# Patient Record
Sex: Male | Born: 1950 | Race: White | Hispanic: No | Marital: Married | Smoking: Current every day smoker
Health system: Southern US, Community
[De-identification: ages and names within clinical notes are randomized; demographics above are authoritative.]

## PROBLEM LIST (undated history)

## (undated) DIAGNOSIS — I10 Essential (primary) hypertension: Secondary | ICD-10-CM

## (undated) DIAGNOSIS — C4490 Unspecified malignant neoplasm of skin, unspecified: Secondary | ICD-10-CM

## (undated) DIAGNOSIS — Z9849 Cataract extraction status, unspecified eye: Secondary | ICD-10-CM

## (undated) HISTORY — DX: Cataract extraction status, unspecified eye: Z98.49

## (undated) HISTORY — DX: Essential (primary) hypertension: I10

## (undated) HISTORY — DX: Unspecified malignant neoplasm of skin, unspecified: C44.90

## (undated) HISTORY — PX: SKIN CANCER EXCISION: SHX779

---

## 2013-10-27 DIAGNOSIS — K635 Polyp of colon: Secondary | ICD-10-CM | POA: Insufficient documentation

## 2014-08-09 LAB — TROPONIN T: Troponin T: <0.01

## 2014-08-09 LAB — BASIC METABOLIC PANEL
CREATININE: 0.9 mg/dL (ref 0.6–1.3)
POTASSIUM: 4.2 mmol/L (ref 3.4–5.3)
SODIUM: 139 mmol/L (ref 137–147)

## 2014-08-09 LAB — DRUGS OF ABUSE SCREEN W/O ALC, ROUTINE URINE
AMPHETAMINE SCREEN URINE: NEGATIVE
BENZODIAZEPINE SCREEN, URINE: NEGATIVE
Barbiturate screen, urine: NEGATIVE
CANNABINOID SCRN UR: NEGATIVE
Cocaine (Metab) Scrn, Ur: NEGATIVE
METHADONE SCREEN, URINE: NEGATIVE
OPIATE SCREEN URINE: NEGATIVE
OXYCODONE SCRN UR: NEGATIVE
URINE, PH: 6

## 2014-08-09 LAB — COMPREHENSIVE METABOLIC PANEL: CHLORIDE: 100 mmol/L

## 2014-08-09 LAB — MAGNESIUM: Magnesium: 2.1

## 2014-08-09 LAB — CBC WITH DIFFERENTIAL: MCV: 85

## 2014-08-09 LAB — CBC AND DIFFERENTIAL
HCT: 52 % (ref 41–53)
Hemoglobin: 17.6 g/dL — AB (ref 13.5–17.5)
Platelets: 225 10*3/uL (ref 150–399)
WBC: 12.7 10*3/mL

## 2014-08-09 LAB — HEPATIC FUNCTION PANEL
ALT: 30 U/L (ref 10–40)
AST: 23 U/L (ref 14–40)
Bilirubin, Total: 0.4 mg/dL

## 2015-01-26 ENCOUNTER — Ambulatory Visit (INDEPENDENT_AMBULATORY_CARE_PROVIDER_SITE_OTHER): Payer: Self-pay | Admitting: Family Medicine

## 2015-01-26 ENCOUNTER — Encounter: Payer: Self-pay | Admitting: Family Medicine

## 2015-01-26 VITALS — BP 184/73 | HR 64 | Ht 71.0 in | Wt 220.0 lb

## 2015-01-26 DIAGNOSIS — Z23 Encounter for immunization: Secondary | ICD-10-CM

## 2015-01-26 DIAGNOSIS — M549 Dorsalgia, unspecified: Secondary | ICD-10-CM

## 2015-01-26 DIAGNOSIS — E669 Obesity, unspecified: Secondary | ICD-10-CM | POA: Insufficient documentation

## 2015-01-26 DIAGNOSIS — Z1211 Encounter for screening for malignant neoplasm of colon: Secondary | ICD-10-CM

## 2015-01-26 DIAGNOSIS — F1010 Alcohol abuse, uncomplicated: Secondary | ICD-10-CM | POA: Insufficient documentation

## 2015-01-26 DIAGNOSIS — I10 Essential (primary) hypertension: Secondary | ICD-10-CM

## 2015-01-26 DIAGNOSIS — M542 Cervicalgia: Secondary | ICD-10-CM | POA: Insufficient documentation

## 2015-01-26 DIAGNOSIS — C4490 Unspecified malignant neoplasm of skin, unspecified: Secondary | ICD-10-CM

## 2015-01-26 DIAGNOSIS — G8929 Other chronic pain: Secondary | ICD-10-CM | POA: Insufficient documentation

## 2015-01-26 DIAGNOSIS — R609 Edema, unspecified: Secondary | ICD-10-CM

## 2015-01-26 DIAGNOSIS — G4733 Obstructive sleep apnea (adult) (pediatric): Secondary | ICD-10-CM

## 2015-01-26 MED ORDER — HYDROCHLOROTHIAZIDE 25 MG PO TABS: 25.0000 mg | ORAL_TABLET | Freq: Every day | ORAL | Status: DC

## 2015-01-26 NOTE — Assessment & Plan Note (Signed)
Likely given history and physical findings. Will refer for polysomnography in future.

## 2015-01-26 NOTE — Assessment & Plan Note (Signed)
Deferred skin exam and referral to dermatology today. Counseled patients on concerning skin lesion characteristics.

## 2015-01-26 NOTE — Progress Notes (Signed)
Manuel Dawson is a 65 y.o. male who presents to [facility] today for establish care.  Patient recently seen in ER for bilateral LE. He was given HCTZ for concurrent high blood pressure, which he has also received in the past for blood pressure. He ran out of the prescription and did not have PCP, which brought him in today.  Bilateral lower edema: Worsening over last 3 months. Has taken lasix in the past which he couldn't tolerate due to urinary frequency.   HTN: Diagnosed in the past. Always has been borderline according to patient. Has smoked cigarettes and then cigars daily for over 45 years. He has cut back cigar intake to 3/day but would not like to cut back any further at this point. Patient drinks 6-8 light beers daily, had been told by his ex-wife to cut back over 10 years ago. He nor his current wife have had thoughts of need to cut back, been annoyed by his ex wife asking him to cut back, has never felt guilty about his drinking, has never had an eye opener.   Back pain: Notes central axial back pain that he would like to defer discussing until a future visit. Notes having to sleep sitting up in a chair to sleep comfortably.   Skin cancer: works outside, does not use sunscreen, prior mole removed and unknown skin cancer on face removed. Has not seen dermatologist since removal of skin CA on face. Defers complete skin exam today.   OSA: Snores loudly, daytime somnolence. No night time apneic events. No morning headaches. Has not had polysomnography in the past.   Health maintenance: Patient has never had colonoscopy, willing to undergo referral process. Patient over 10 years since last Tdap. Willing to have today. Has not been tested for Hep C and denies having HIV, though says he is willing to get tested for both. Patient defers shingles and flu vaccines.     Past Medical  History  Diagnosis Date  . Hypertension   . Skin cancer    Past Surgical History  Procedure Laterality Date  . Skin cancer excision     Social History  Substance Use Topics  . Smoking status: Current Every Day Smoker -- 3.00 packs/day for 45 years    Types: Cigars  . Smokeless tobacco: Not on file  . Alcohol Use: 30.0 oz/week    0 Standard drinks or equivalent, 50 Cans of beer per week   family history includes Heart disease in his father; Stroke in his mother.  ROS as above: No fevers chills nausea vomiting diarrhea chest pain or palpitations. Patient does not feel too hot or too cold. No significant hair loss. Medications: Current Outpatient Prescriptions  Medication Sig Dispense Refill  . hydrochlorothiazide (HYDRODIURIL) 25 MG tablet Take 1 tablet (25 mg total) by mouth daily. 30 tablet 0   No current facility-administered medications for this visit.   No Known Allergies   Exam:  BP 184/73 mmHg  Pulse 64  Ht 5\' 11"  (1.803 m)  Wt 220 lb (99.791 kg)  BMI 30.70 kg/m2 Gen: Chronically ill appearing man in NAD HEENT: EOMI,  MMM Lungs: Normal work of breathing. CTABL, no wheezes or crackles.  Heart: RRR no MRG. No JVD appreciated.  Abd: NABS, Soft. Nondistended, Nontender. Protuberant abdomen. No HSM noted or fluid waves appreciated.  Exts: Brisk capillary refill, warm and well perfused. Bilateral 1+ edema to the knee bilaterally. Blue hue discoloration to bilateral  LE. Pulses 2+ DP but 1+ in PT. Toenails yellow and opacified, poorly maintained.  Psych: Alert and oriented normal affect speech and thought process.  No results found for this or any previous visit (from the past 24 hour(s)). No results found.  EKG: 60 BPM PR 148 QRS 80 QTc 426 Nl axis Impression: Normal sinus rhythm, no signs of LVH  Please see individual assessment and plan sections.

## 2015-01-26 NOTE — Assessment & Plan Note (Signed)
Refer to gastroenterology next visit.

## 2015-01-26 NOTE — Assessment & Plan Note (Addendum)
Likely chronic, uncontrolled. EKG negative for LVH. Will consider echo in the future. Will start back HCTZ 25mg . RTC in 2 weeks to review labs and consider adding ACE-I.   Will get screening labs for general health maintenance, administered TDap vaccine.

## 2015-01-26 NOTE — Patient Instructions (Signed)
Thank you for coming in today. You were seen today to establish care. You have high blood pressure, we will restart one medication today, but you will likely need more. We would like to see you 2-4 weeks to check your kidney function to make sure the medication is working properly.  We will get a number of screening labs. Please return for those when ou have had nothing to eat or drink (but water) after midnight.   Terbinafine tablets What is this medicine? TERBINAFINE (TER bin a feen) is an antifungal medicine. It is used to treat certain kinds of fungal or yeast infections. This medicine may be used for other purposes; ask your health care provider or pharmacist if you have questions. What should I tell my health care provider before I take this medicine? They need to know if you have any of these conditions: -drink alcoholic beverages -kidney disease -liver disease -an unusual or allergic reaction to terbinafine, other medicines, foods, dyes, or preservatives -pregnant or trying to get pregnant -breast-feeding How should I use this medicine? Take this medicine by mouth with a full glass of water. Follow the directions on the prescription label. You can take this medicine with food or on an empty stomach. Take your medicine at regular intervals. Do not take your medicine more often than directed. Do not skip doses or stop your medicine early even if you feel better. Do not stop taking except on your doctor's advice. Talk to your pediatrician regarding the use of this medicine in children. Special care may be needed. Overdosage: If you think you have taken too much of this medicine contact a poison control center or emergency room at once. NOTE: This medicine is only for you. Do not share this medicine with others. What if I miss a dose? If you miss a dose, take it as soon as you can. If it is almost time for your next dose, take only that dose. Do not take double or extra doses. What may  interact with this medicine? Do not take this medicine with any of the following medications: -thioridazine This medicine may also interact with the following medications: -beta-blockers -caffeine -cimetidine -cyclosporine -medicines for depression, anxiety, or psychotic disturbances -medicines for fungal infections like fluconazole and ketoconazole -medicines for irregular heartbeat like amiodarone, flecainide and propafenone -rifampin -warfarin This list may not describe all possible interactions. Give your health care provider a list of all the medicines, herbs, non-prescription drugs, or dietary supplements you use. Also tell them if you smoke, drink alcohol, or use illegal drugs. Some items may interact with your medicine. What should I watch for while using this medicine? Visit your doctor or health care provider regularly. Tell your doctor right away if you have nausea or vomiting, loss of appetite, stomach pain on your right upper side, yellow skin, dark urine, light stools, or are over tired. Some fungal infections need many weeks or months of treatment to cure. If you are taking this medicine for a long time, you will need to have important blood work done. What side effects may I notice from receiving this medicine? Side effects that you should report to your doctor or health care professional as soon as possible: -allergic reactions like skin rash or hives, swelling of the face, lips, or tongue -changes in vision -dark urine -fever or infection -general ill feeling or flu-like symptoms -light-colored stools -loss of appetite, nausea -redness, blistering, peeling or loosening of the skin, including inside the mouth -right upper belly  pain -unusually weak or tired -yellowing of the eyes or skin Side effects that usually do not require medical attention (report to your doctor or health care professional if they continue or are bothersome): -changes in taste -diarrhea -hair  loss -muscle or joint pain -stomach gas -stomach upset This list may not describe all possible side effects. Call your doctor for medical advice about side effects. You may report side effects to FDA at 1-800-FDA-1088. Where should I keep my medicine? Keep out of the reach of children. Store at room temperature below 25 degrees C (77 degrees F). Protect from light. Throw away any unused medicine after the expiration date. NOTE: This sheet is a summary. It may not cover all possible information. If you have questions about this medicine, talk to your doctor, pharmacist, or health care provider.    2016, Elsevier/Gold Standard. (2007-03-22 16:28:07)

## 2015-01-29 ENCOUNTER — Encounter: Payer: Self-pay | Admitting: Family Medicine

## 2015-01-29 LAB — CBC
HEMATOCRIT: 50.4 % (ref 39.0–52.0)
Hemoglobin: 17.4 g/dL — ABNORMAL HIGH (ref 13.0–17.0)
MCH: 29.2 pg (ref 26.0–34.0)
MCHC: 34.5 g/dL (ref 30.0–36.0)
MCV: 84.7 fL (ref 78.0–100.0)
MPV: 10.4 fL (ref 8.6–12.4)
Platelets: 241 10*3/uL (ref 150–400)
RBC: 5.95 MIL/uL — AB (ref 4.22–5.81)
RDW: 14.2 % (ref 11.5–15.5)
WBC: 7.5 10*3/uL (ref 4.0–10.5)

## 2015-01-29 LAB — HEMOGLOBIN A1C
HEMOGLOBIN A1C: 6.1 % — AB (ref <5.7)
MEAN PLASMA GLUCOSE: 128 mg/dL — AB (ref <117)

## 2015-01-30 LAB — HEPATITIS C ANTIBODY: HCV Ab: NEGATIVE

## 2015-01-30 LAB — LIPID PANEL
CHOL/HDL RATIO: 2.7 ratio (ref <=5.0)
CHOLESTEROL: 138 mg/dL (ref 125–200)
HDL: 52 mg/dL (ref >=40)
LDL Cholesterol: 79 mg/dL (ref <130)
TRIGLYCERIDES: 34 mg/dL (ref <150)
VLDL: 7 mg/dL (ref <30)

## 2015-01-30 LAB — COMPREHENSIVE METABOLIC PANEL
ALK PHOS: 78 U/L (ref 40–115)
ALT: 26 U/L (ref 9–46)
AST: 20 U/L (ref 10–35)
Albumin: 4.2 g/dL (ref 3.6–5.1)
BUN: 18 mg/dL (ref 7–25)
CALCIUM: 9.8 mg/dL (ref 8.6–10.3)
CO2: 23 mmol/L (ref 20–31)
Chloride: 101 mmol/L (ref 98–110)
Creat: 1.03 mg/dL (ref 0.70–1.25)
Glucose, Bld: 115 mg/dL — ABNORMAL HIGH (ref 65–99)
POTASSIUM: 4.6 mmol/L (ref 3.5–5.3)
Sodium: 138 mmol/L (ref 135–146)
TOTAL PROTEIN: 7.2 g/dL (ref 6.1–8.1)
Total Bilirubin: 0.5 mg/dL (ref 0.2–1.2)

## 2015-01-30 LAB — HIV ANTIBODY (ROUTINE TESTING W REFLEX): HIV 1&2 Ab, 4th Generation: NONREACTIVE

## 2015-01-30 LAB — TSH: TSH: 1.603 u[IU]/mL (ref 0.350–4.500)

## 2015-01-30 LAB — VITAMIN D 25 HYDROXY (VIT D DEFICIENCY, FRACTURES): Vit D, 25-Hydroxy: 28 ng/mL — ABNORMAL LOW (ref 30–100)

## 2015-02-01 ENCOUNTER — Encounter: Payer: Self-pay | Admitting: Family Medicine

## 2015-02-01 DIAGNOSIS — R7303 Prediabetes: Secondary | ICD-10-CM | POA: Insufficient documentation

## 2015-02-01 NOTE — Progress Notes (Signed)
Quick Note:  1) Vitamin D deficiency noted. Take 2000 units of vitamin D daily over-the-counter.  2) Pre-diabetes seen. Will discuss further at the next visit.   ______

## 2015-02-16 ENCOUNTER — Ambulatory Visit: Payer: BLUE CROSS/BLUE SHIELD | Admitting: Family Medicine

## 2015-02-16 ENCOUNTER — Telehealth: Payer: Self-pay | Admitting: Family Medicine

## 2015-02-16 DIAGNOSIS — I10 Essential (primary) hypertension: Secondary | ICD-10-CM

## 2015-02-16 NOTE — Telephone Encounter (Signed)
Pt was late to his appointment today. We will get labs and f/u at a later date/time.

## 2015-02-17 ENCOUNTER — Encounter: Payer: Self-pay | Admitting: Family Medicine

## 2015-02-17 ENCOUNTER — Ambulatory Visit: Payer: BLUE CROSS/BLUE SHIELD | Admitting: Family Medicine

## 2015-02-17 ENCOUNTER — Ambulatory Visit (INDEPENDENT_AMBULATORY_CARE_PROVIDER_SITE_OTHER): Payer: BLUE CROSS/BLUE SHIELD | Admitting: Family Medicine

## 2015-02-17 VITALS — BP 171/85 | HR 71 | Wt 217.0 lb

## 2015-02-17 DIAGNOSIS — I10 Essential (primary) hypertension: Secondary | ICD-10-CM | POA: Diagnosis not present

## 2015-02-17 DIAGNOSIS — R600 Localized edema: Secondary | ICD-10-CM | POA: Insufficient documentation

## 2015-02-17 DIAGNOSIS — Z1211 Encounter for screening for malignant neoplasm of colon: Secondary | ICD-10-CM

## 2015-02-17 DIAGNOSIS — G4733 Obstructive sleep apnea (adult) (pediatric): Secondary | ICD-10-CM | POA: Diagnosis not present

## 2015-02-17 LAB — BASIC METABOLIC PANEL
BUN: 12 mg/dL (ref 7–25)
CO2: 27 mmol/L (ref 20–31)
Calcium: 9.4 mg/dL (ref 8.6–10.3)
Chloride: 101 mmol/L (ref 98–110)
Creat: 0.91 mg/dL (ref 0.70–1.25)
GLUCOSE: 109 mg/dL — AB (ref 65–99)
POTASSIUM: 4.4 mmol/L (ref 3.5–5.3)
SODIUM: 138 mmol/L (ref 135–146)

## 2015-02-17 MED ORDER — LISINOPRIL-HYDROCHLOROTHIAZIDE 20-25 MG PO TABS: 1.0000 | ORAL_TABLET | Freq: Every day | ORAL | Status: DC

## 2015-02-17 NOTE — Assessment & Plan Note (Signed)
Add lisinopril to a combination pill to include lisinopril and hydrochlorothiazide 20/25. Recheck in 1 month.

## 2015-02-17 NOTE — Assessment & Plan Note (Signed)
Obtain home sleep study °

## 2015-02-17 NOTE — Telephone Encounter (Signed)
Quick Note:  Normal, no changes. ______ 

## 2015-02-17 NOTE — Assessment & Plan Note (Signed)
Kidney function normal. Obtain echocardiogram. Recheck in one month

## 2015-02-17 NOTE — Progress Notes (Signed)
       Manuel Dawson is a 65 y.o. male who presents to [facility] today for follow-up hypertension  1) hypertension: Patient was seen last month and found to be significantly hypertensive. He was started on hydrochlorothiazide. He tolerates the medicine well with no chest pains palpitations fevers or chills.  2) leg swelling: Patient notes lower extremity edema. This is been ongoing for some time and has not improved much with hydrochlorothiazide. Evaluation by other physicians have not shown significant pulmonary edema on chest x-rays. He has never had an echocardiogram.  3) possible sleep apnea: Patient has elevated blood pressure and snores. He feels tired frequently. He has never been tested for sleep apnea.  4) health maintenance: Due for colon cancer screening. Patient would like to have a colonoscopy instead of fecal DNA testing.   Past Medical History  Diagnosis Date  . Hypertension   . Skin cancer   . Cataract extraction status    Past Surgical History  Procedure Laterality Date  . Skin cancer excision     Social History  Substance Use Topics  . Smoking status: Current Every Day Smoker -- 3.00 packs/day for 45 years    Types: Cigars  . Smokeless tobacco: Not on file  . Alcohol Use: 30.0 oz/week    0 Standard drinks or equivalent, 50 Cans of beer per week   family history includes Heart disease in his father; Stroke in his mother.  ROS as above Medications: Current Outpatient Prescriptions  Medication Sig Dispense Refill  . aspirin 81 MG chewable tablet Chew by mouth.    Marland Kitchen lisinopril-hydrochlorothiazide (PRINZIDE,ZESTORETIC) 20-25 MG tablet Take 1 tablet by mouth daily. 30 tablet 0   No current facility-administered medications for this visit.   No Known Allergies   Exam:  BP 171/85 mmHg  Pulse 71  Wt 217 lb (98.431 kg)  Body mass index is 30.28  kg/(m^2).  Gen: Well NAD HEENT: EOMI,  MMM Lungs: Normal work of breathing. CTABL Heart: RRR no MRG Abd: NABS, Soft. Nondistended, Nontender Exts: Brisk capillary refill, warm and well perfused. 1+ bilateral lower extremity edema.  STOP BANG: Snore:    Yes Tired:     Yes Observed stop breathing:  No Hypertension:   Yes  BMI >35:   No Age >50:   Yes Neck > 16 inches:  Yes Male gender:   Yes ------------------------------------------ Total:     6/8   No results found for this or any previous visit (from the past 24 hour(s)). No results found.   Please see individual assessment and plan sections.

## 2015-02-17 NOTE — Assessment & Plan Note (Signed)
Colonoscopy ordered.

## 2015-02-17 NOTE — Patient Instructions (Signed)
Thank you for coming in today. 1) STOP HCTZ. Start lisinopril/hydrochlorothiazide blood pressure pill Return in one month 2) sleep: You should hear from [place] within a few days about a sleep study 3) colonoscopy: You should hear from digestive health specialist about setting up a colonoscopy in the near future 4) heart: We have arranged for a ultrasound of your heart. Please let me know if you do not hear from them soon.  Return in 1 month.  Call or go to the emergency room if you get worse, have trouble breathing, have chest pains, or palpitations.    Echocardiogram An echocardiogram, or echocardiography, uses sound waves (ultrasound) to produce an image of your heart. The echocardiogram is simple, painless, obtained within a short period of time, and offers valuable information to your health care provider. The images from an echocardiogram can provide information such as:  Evidence of coronary artery disease (CAD).  Heart size.  Heart muscle function.  Heart valve function.  Aneurysm detection.  Evidence of a past heart attack.  Fluid buildup around the heart.  Heart muscle thickening.  Assess heart valve function. LET [facility] CARE PROVIDER KNOW ABOUT:  Any allergies you have.  All medicines you are taking, including vitamins, herbs, eye drops, creams, and over-the-counter medicines.  Previous problems you or members of your family have had with the use of anesthetics.  Any blood disorders you have.  Previous surgeries you have had.  Medical conditions you have.  Possibility of pregnancy, if this applies. BEFORE THE PROCEDURE  No special preparation is needed. Eat and drink normally.  PROCEDURE   In order to produce an image of your heart, gel will be applied to your chest and a wand-like tool (transducer) will be moved over your chest. The gel will help transmit the sound waves from the transducer. The sound waves will harmlessly bounce off  your heart to allow the heart images to be captured in real-time motion. These images will then be recorded.  You may need an IV to receive a medicine that improves the quality of the pictures. AFTER THE PROCEDURE You may return to your normal schedule including diet, activities, and medicines, unless your health care provider tells you otherwise.   This information is not intended to replace advice given to you by your health care provider. Make sure you discuss any questions you have with your health care provider.   Document Released: 01/07/2000 Document Revised: 01/30/2014 Document Reviewed: 09/16/2012 Elsevier Interactive Patient Education [employer].

## 2015-02-24 ENCOUNTER — Ambulatory Visit (HOSPITAL_BASED_OUTPATIENT_CLINIC_OR_DEPARTMENT_OTHER)
Admission: RE | Admit: 2015-02-24 | Discharge: 2015-02-24 | Disposition: A | Discharge status: Home / Self Care | Payer: BLUE CROSS/BLUE SHIELD | Source: Ambulatory Visit | Attending: Family Medicine | Admitting: Family Medicine

## 2015-02-24 DIAGNOSIS — G4733 Obstructive sleep apnea (adult) (pediatric): Secondary | ICD-10-CM | POA: Diagnosis not present

## 2015-02-24 DIAGNOSIS — F17200 Nicotine dependence, unspecified, uncomplicated: Secondary | ICD-10-CM | POA: Diagnosis not present

## 2015-02-24 DIAGNOSIS — I10 Essential (primary) hypertension: Secondary | ICD-10-CM | POA: Diagnosis not present

## 2015-02-24 DIAGNOSIS — R600 Localized edema: Secondary | ICD-10-CM

## 2015-02-24 DIAGNOSIS — I313 Pericardial effusion (noninflammatory): Secondary | ICD-10-CM | POA: Insufficient documentation

## 2015-02-24 DIAGNOSIS — I351 Nonrheumatic aortic (valve) insufficiency: Secondary | ICD-10-CM | POA: Insufficient documentation

## 2015-02-24 DIAGNOSIS — I517 Cardiomegaly: Secondary | ICD-10-CM | POA: Diagnosis not present

## 2015-02-24 NOTE — Progress Notes (Signed)
  Echocardiogram 2D Echocardiogram has been performed.  Manuel Dawson 02/24/2015, 2:47 PM

## 2015-02-25 DIAGNOSIS — I517 Cardiomegaly: Secondary | ICD-10-CM | POA: Insufficient documentation

## 2015-02-25 NOTE — Progress Notes (Signed)
Quick Note:  ECHO was good. No heart failure seen. ______

## 2015-03-17 ENCOUNTER — Telehealth: Payer: Self-pay | Admitting: Family Medicine

## 2015-03-17 NOTE — Telephone Encounter (Signed)
Patient had to cancel his appt due to work schedule will call back to schedule but his script runs out Friday and is calling the pharmacy to get a refill. Thanks

## 2015-03-19 ENCOUNTER — Ambulatory Visit: Payer: BLUE CROSS/BLUE SHIELD | Admitting: Family Medicine

## 2015-03-19 ENCOUNTER — Other Ambulatory Visit: Payer: Self-pay

## 2015-03-19 DIAGNOSIS — I10 Essential (primary) hypertension: Secondary | ICD-10-CM

## 2015-03-19 MED ORDER — LISINOPRIL-HYDROCHLOROTHIAZIDE 20-25 MG PO TABS: 1.0000 | ORAL_TABLET | Freq: Every day | ORAL | Status: DC

## 2015-03-20 ENCOUNTER — Other Ambulatory Visit: Payer: Self-pay | Admitting: Family Medicine

## 2015-03-31 ENCOUNTER — Encounter: Payer: Self-pay | Admitting: Family Medicine

## 2015-04-07 ENCOUNTER — Ambulatory Visit (INDEPENDENT_AMBULATORY_CARE_PROVIDER_SITE_OTHER): Payer: BLUE CROSS/BLUE SHIELD | Admitting: Family Medicine

## 2015-04-07 ENCOUNTER — Encounter: Payer: Self-pay | Admitting: Family Medicine

## 2015-04-07 VITALS — BP 144/62 | HR 64 | Wt 213.0 lb

## 2015-04-07 DIAGNOSIS — I10 Essential (primary) hypertension: Secondary | ICD-10-CM | POA: Diagnosis not present

## 2015-04-07 DIAGNOSIS — N400 Enlarged prostate without lower urinary tract symptoms: Secondary | ICD-10-CM

## 2015-04-07 DIAGNOSIS — L723 Sebaceous cyst: Secondary | ICD-10-CM

## 2015-04-07 DIAGNOSIS — L089 Local infection of the skin and subcutaneous tissue, unspecified: Secondary | ICD-10-CM

## 2015-04-07 DIAGNOSIS — M25559 Pain in unspecified hip: Secondary | ICD-10-CM

## 2015-04-07 MED ORDER — AMLODIPINE BESYLATE 10 MG PO TABS: 10.0000 mg | ORAL_TABLET | Freq: Every day | ORAL | Status: DC

## 2015-04-07 MED ORDER — TAMSULOSIN HCL 0.4 MG PO CAPS: 0.4000 mg | ORAL_CAPSULE | Freq: Every day | ORAL | Status: DC

## 2015-04-07 MED ORDER — GABAPENTIN 300 MG PO CAPS: ORAL_CAPSULE | ORAL | Status: DC

## 2015-04-07 MED ORDER — LISINOPRIL-HYDROCHLOROTHIAZIDE 20-25 MG PO TABS: 1.0000 | ORAL_TABLET | Freq: Every day | ORAL | Status: DC

## 2015-04-07 NOTE — Patient Instructions (Signed)
Thank you for coming in today. Get blood work today.  Continue lisinopril/HCTZ.  Take amlodipine for blood pressure as well.  Take flomax for prostate.  Return in 1 month.   Call or go to the emergency room if you get worse, have trouble breathing, have chest pains, or palpitations.   Research Gabapentin.   Gabapentin capsules or tablets What is this medicine? GABAPENTIN (GA ba pen tin) is used to control partial seizures in adults with epilepsy. It is also used to treat certain types of nerve pain. This medicine may be used for other purposes; ask your health care provider or pharmacist if you have questions. What should I tell my health care provider before I take this medicine? They need to know if you have any of these conditions: -kidney disease -suicidal thoughts, plans, or attempt; a previous suicide attempt by you or a family member -an unusual or allergic reaction to gabapentin, other medicines, foods, dyes, or preservatives -pregnant or trying to get pregnant -breast-feeding How should I use this medicine? Take this medicine by mouth with a glass of water. Follow the directions on the prescription label. You can take it with or without food. If it upsets your stomach, take it with food.Take your medicine at regular intervals. Do not take it more often than directed. Do not stop taking except on your doctor's advice. If you are directed to break the 600 or 800 mg tablets in half as part of your dose, the extra half tablet should be used for the next dose. If you have not used the extra half tablet within 28 days, it should be thrown away. A special MedGuide will be given to you by the pharmacist with each prescription and refill. Be sure to read this information carefully each time. Talk to your pediatrician regarding the use of this medicine in children. Special care may be needed. Overdosage: If you think you have taken too much of this medicine contact a poison control center  or emergency room at once. NOTE: This medicine is only for you. Do not share this medicine with others. What if I miss a dose? If you miss a dose, take it as soon as you can. If it is almost time for your next dose, take only that dose. Do not take double or extra doses. What may interact with this medicine? Do not take this medicine with any of the following medications: -other gabapentin products This medicine may also interact with the following medications: -alcohol -antacids -antihistamines for allergy, cough and cold -certain medicines for anxiety or sleep -certain medicines for depression or psychotic disturbances -homatropine; hydrocodone -naproxen -narcotic medicines (opiates) for pain -phenothiazines like chlorpromazine, mesoridazine, prochlorperazine, thioridazine This list may not describe all possible interactions. Give your health care provider a list of all the medicines, herbs, non-prescription drugs, or dietary supplements you use. Also tell them if you smoke, drink alcohol, or use illegal drugs. Some items may interact with your medicine. What should I watch for while using this medicine? Visit your doctor or health care professional for regular checks on your progress. You may want to keep a record at home of how you feel your condition is responding to treatment. You may want to share this information with your doctor or health care professional at each visit. You should contact your doctor or health care professional if your seizures get worse or if you have any new types of seizures. Do not stop taking this medicine or any of your seizure  medicines unless instructed by your doctor or health care professional. Stopping your medicine suddenly can increase your seizures or their severity. Wear a medical identification bracelet or chain if you are taking this medicine for seizures, and carry a card that lists all your medications. You may get drowsy, dizzy, or have blurred  vision. Do not drive, use machinery, or do anything that needs mental alertness until you know how this medicine affects you. To reduce dizzy or fainting spells, do not sit or stand up quickly, especially if you are an older patient. Alcohol can increase drowsiness and dizziness. Avoid alcoholic drinks. Your mouth may get dry. Chewing sugarless gum or sucking hard candy, and drinking plenty of water will help. The use of this medicine may increase the chance of suicidal thoughts or actions. Pay special attention to how you are responding while on this medicine. Any worsening of mood, or thoughts of suicide or dying should be reported to your health care professional right away. Women who become pregnant while using this medicine may enroll in the [place] Pregnancy Registry by calling [phone]. This registry collects information about the safety of antiepileptic drug use during pregnancy. What side effects may I notice from receiving this medicine? Side effects that you should report to your doctor or health care professional as soon as possible: -allergic reactions like skin rash, itching or hives, swelling of the face, lips, or tongue -worsening of mood, thoughts or actions of suicide or dying Side effects that usually do not require medical attention (report to your doctor or health care professional if they continue or are bothersome): -constipation -difficulty walking or controlling muscle movements -dizziness -nausea -slurred speech -tiredness -tremors -weight gain This list may not describe all possible side effects. Call your doctor for medical advice about side effects. You may report side effects to FDA at 1-800-FDA-1088. Where should I keep my medicine? Keep out of reach of children. This medicine may cause accidental overdose and death if it taken by other adults, children, or pets. Mix any unused medicine with a substance like cat litter or coffee  grounds. Then throw the medicine away in a sealed container like a sealed bag or a coffee can with a lid. Do not use the medicine after the expiration date. Store at room temperature between 15 and 30 degrees C (59 and 86 degrees F). NOTE: This sheet is a summary. It may not cover all possible information. If you have questions about this medicine, talk to your doctor, pharmacist, or health care provider.    2016, Elsevier/Gold Standard. (2013-03-07 15:26:50)

## 2015-04-07 NOTE — Assessment & Plan Note (Signed)
Urinary symptoms likely due to BPH. Patient declines rectal exam today. Start Flomax. Recheck in one month check PSA

## 2015-04-07 NOTE — Progress Notes (Signed)
       Manuel Dawson is a 65 y.o. male who presents to [facility] today for follow-up hypertension, discuss BPH, discuss cyst on left forearm, and discuss anterior thigh burning pain.  1) hypertension: Patient is taking lisinopril/hydrochlorothiazide. He tolerates the medicine well with no chest pain palpitations or shortness of breath. He feels well.  2) urinary frequency. Patient notes difficulty urinating difficulty with frequent urination and feeling as though he does not empty his bladder fully. He's been told he has a large prostate. He's had a normal digital rectal exam less than one year ago.  AUA symptom score is 29  3) left forearm cyst present for several weeks worsening and becoming more red painful and swollen. No discharge. No injury. A similar cyst on his back in the past that required excision and drainage.  4) anterior thigh burning. Present chronically now for years. Worse with prolonged standing better with rest. No change with exertion. No calf discomfort. Patient has severe chronic back DDD.   Past Medical History  Diagnosis Date  . Hypertension   . Skin cancer   . Cataract extraction status    Past Surgical History  Procedure Laterality Date  . Skin cancer excision     Social History  Substance Use Topics  . Smoking status: Current Every Day Smoker -- 3.00 packs/day for 45 years    Types: Cigars  . Smokeless tobacco: Not on file  . Alcohol Use: 30.0 oz/week    0 Standard drinks or equivalent, 50 Cans of beer per week   family history includes Heart disease in his father; Stroke in his mother.  ROS as above Medications: Current Outpatient Prescriptions  Medication Sig Dispense Refill  . aspirin 81 MG chewable tablet Chew by mouth.    Marland Kitchen lisinopril-hydrochlorothiazide (PRINZIDE,ZESTORETIC) 20-25 MG tablet Take 1 tablet by mouth daily. 30 tablet 0  .  amLODipine (NORVASC) 10 MG tablet Take 1 tablet (10 mg total) by mouth daily. 30 tablet 11  . gabapentin (NEURONTIN) 300 MG capsule One tab PO qHS for a week, then BID for a week, then TID. May double weekly to a max of 3,600mg /day 180 capsule 3  . tamsulosin (FLOMAX) 0.4 MG CAPS capsule Take 1 capsule (0.4 mg total) by mouth daily after breakfast. 90 capsule 3   No current facility-administered medications for this visit.   No Known Allergies   Exam:  BP 144/62 mmHg  Pulse 64  Wt 213 lb (96.616 kg) Gen: Well NAD HEENT: EOMI,  MMM Lungs: Normal work of breathing. CTABL Heart: RRR no MRG Abd: NABS, Soft. Nondistended, Nontender Exts: Brisk capillary refill, warm and well perfused.  Back: Nontender to midline. Decreased motion. Positive slump test with reproduction of burning pain bilaterally. Hips nontender. Normal motion. Left forearm: One and a half centimeter fluctuant erythematous cyst with no surrounding skin erythema or induration. Pulses capillary refill and sensation are intact bilateral lower extremities  Incision and drainage of left forearm cyst: Consent obtained and timeout performed. Skin cleaned with alcohol. Cold spray applied. Stab incision made to the area of fluctuance.  Pus drained. Sebaceous material expressed. Loculations broken up further pus and sebaceous material were expressed.  Dressing applied. Patient tolerated procedure well.  No results found for this or any previous visit (from the past 24 hour(s)). No results found.   Please see individual assessment and plan sections.

## 2015-04-07 NOTE — Assessment & Plan Note (Signed)
Drained today. Recheck in one month

## 2015-04-07 NOTE — Assessment & Plan Note (Signed)
Likely lumbar radiculopathy. Trial of gabapentin. Recheck in one month

## 2015-04-07 NOTE — Assessment & Plan Note (Signed)
Add amlodipine. Recheck in one month

## 2015-05-05 ENCOUNTER — Ambulatory Visit: Payer: BLUE CROSS/BLUE SHIELD | Admitting: Family Medicine

## 2015-05-22 ENCOUNTER — Other Ambulatory Visit: Payer: Self-pay | Admitting: Family Medicine

## 2015-05-25 ENCOUNTER — Other Ambulatory Visit: Payer: Self-pay | Admitting: Family Medicine

## 2015-06-14 ENCOUNTER — Ambulatory Visit (INDEPENDENT_AMBULATORY_CARE_PROVIDER_SITE_OTHER): Payer: BLUE CROSS/BLUE SHIELD | Admitting: Family Medicine

## 2015-06-14 ENCOUNTER — Ambulatory Visit (INDEPENDENT_AMBULATORY_CARE_PROVIDER_SITE_OTHER): Payer: BLUE CROSS/BLUE SHIELD

## 2015-06-14 ENCOUNTER — Encounter: Payer: Self-pay | Admitting: Family Medicine

## 2015-06-14 VITALS — BP 129/75 | HR 69 | Wt 210.0 lb

## 2015-06-14 DIAGNOSIS — I10 Essential (primary) hypertension: Secondary | ICD-10-CM

## 2015-06-14 DIAGNOSIS — S39012A Strain of muscle, fascia and tendon of lower back, initial encounter: Secondary | ICD-10-CM | POA: Insufficient documentation

## 2015-06-14 DIAGNOSIS — M549 Dorsalgia, unspecified: Principal | ICD-10-CM

## 2015-06-14 DIAGNOSIS — G8929 Other chronic pain: Secondary | ICD-10-CM

## 2015-06-14 DIAGNOSIS — M545 Low back pain: Secondary | ICD-10-CM

## 2015-06-14 DIAGNOSIS — I700 Atherosclerosis of aorta: Secondary | ICD-10-CM | POA: Diagnosis not present

## 2015-06-14 DIAGNOSIS — S338XXA Sprain of other parts of lumbar spine and pelvis, initial encounter: Secondary | ICD-10-CM | POA: Diagnosis not present

## 2015-06-14 MED ORDER — NAPROXEN 500 MG PO TABS: 500.0000 mg | ORAL_TABLET | Freq: Two times a day (BID) | ORAL | Status: DC

## 2015-06-14 MED ORDER — CYCLOBENZAPRINE HCL 10 MG PO TABS: 10.0000 mg | ORAL_TABLET | Freq: Three times a day (TID) | ORAL | Status: DC | PRN

## 2015-06-14 NOTE — Patient Instructions (Signed)
Thank you for coming in today. Take naproxen for pain as needed.  Use flexeril.  Get xray and labs today.  Return in 2 weeks if not improving.  Come back or go to the emergency room if you notice new weakness new numbness problems walking or bowel or bladder problems.  TENS UNIT: This is helpful for muscle pain and spasm.   Search and Purchase a TENS 7000 2nd edition at www.tenspros.com. It should be less than $30.     TENS unit instructions: Do not shower or bathe with the unit on Turn the unit off before removing electrodes or batteries If the electrodes lose stickiness add a drop of water to the electrodes after they are disconnected from the unit and place on plastic sheet. If you continued to have difficulty, call the TENS unit company to purchase more electrodes. Do not apply lotion on the skin area prior to use. Make sure the skin is clean and dry as this will help prolong the life of the electrodes. After use, always check skin for unusual red areas, rash or other skin difficulties. If there are any skin problems, does not apply electrodes to the same area. Never remove the electrodes from the unit by pulling the wires. Do not use the TENS unit or electrodes other than as directed. Do not change electrode placement without consultating your therapist or physician. Keep 2 fingers with between each electrode. Wear time ratio is 2:1, on to off times.    For example on for 30 minutes off for 15 minutes and then on for 30 minutes off for 15 minutes   Lumbosacral Strain Lumbosacral strain is a strain of any of the parts that make up your lumbosacral vertebrae. Your lumbosacral vertebrae are the bones that make up the lower third of your backbone. Your lumbosacral vertebrae are held together by muscles and tough, fibrous tissue (ligaments).  CAUSES  A sudden blow to your back can cause lumbosacral strain. Also, anything that causes an excessive stretch of the muscles in the low back  can cause this strain. This is typically seen when people exert themselves strenuously, fall, lift heavy objects, bend, or crouch repeatedly. RISK FACTORS  Physically demanding work.  Participation in pushing or pulling sports or sports that require a sudden twist of the back (tennis, golf, baseball).  Weight lifting.  Excessive lower back curvature.  Forward-tilted pelvis.  Weak back or abdominal muscles or both.  Tight hamstrings. SIGNS AND SYMPTOMS  Lumbosacral strain may cause pain in the area of your injury or pain that moves (radiates) down your leg.  DIAGNOSIS Your health care provider can often diagnose lumbosacral strain through a physical exam. In some cases, you may need tests such as X-ray exams.  TREATMENT  Treatment for your lower back injury depends on many factors that your clinician will have to evaluate. However, most treatment will include the use of anti-inflammatory medicines. HOME CARE INSTRUCTIONS   Avoid hard physical activities (tennis, racquetball, waterskiing) if you are not in proper physical condition for it. This may aggravate or create problems.  If you have a back problem, avoid sports requiring sudden body movements. Swimming and walking are generally safer activities.  Maintain good posture.  Maintain a healthy weight.  For acute conditions, you may put ice on the injured area.  Put ice in a plastic bag.  Place a towel between your skin and the bag.  Leave the ice on for 20 minutes, 2-3 times a day.  When  the low back starts healing, stretching and strengthening exercises may be recommended. SEEK MEDICAL CARE IF:  Your back pain is getting worse.  You experience severe back pain not relieved with medicines. SEEK IMMEDIATE MEDICAL CARE IF:   You have numbness, tingling, weakness, or problems with the use of your arms or legs.  There is a change in bowel or bladder control.  You have increasing pain in any area of the body, including  your belly (abdomen).  You notice shortness of breath, dizziness, or feel faint.  You feel sick to your stomach (nauseous), are throwing up (vomiting), or become sweaty.  You notice discoloration of your toes or legs, or your feet get very cold. MAKE SURE YOU:   Understand these instructions.  Will watch your condition.  Will get help right away if you are not doing well or get worse.   This information is not intended to replace advice given to you by your health care provider. Make sure you discuss any questions you have with your health care provider.   Document Released: 10/19/2004 Document Revised: 01/30/2014 Document Reviewed: 08/28/2012 Elsevier Interactive Patient Education [employer].

## 2015-06-14 NOTE — Progress Notes (Signed)
Manuel Dawson is a 65 y.o. male who presents to [facility] today for follow-up hypertension and discuss back pain.  1) back pain: Patient has chronic back pain due to DISH. However his back pain abruptly worsened 4 days ago. He notes pain in the left low back without significant radiation. He notes chronic numbness and tingling into his thighs bilaterally. Pain is worse with extension and better with flexion. No weakness bowel bladder dysfunction. He has tried ibuprofen which helped a little. In the past is used physical therapy which did not help. He is not interested in pursuing physical therapy today.  2) hypertension: Patient is taking the below regimen and feels well with no chest pains palpitations or shortness of breath.   Past Medical History  Diagnosis Date  . Hypertension   . Skin cancer   . Cataract extraction status    Past Surgical History  Procedure Laterality Date  . Skin cancer excision     Social History  Substance Use Topics  . Smoking status: Current Every Day Smoker -- 3.00 packs/day for 45 years    Types: Cigars  . Smokeless tobacco: Not on file  . Alcohol Use: 30.0 oz/week    0 Standard drinks or equivalent, 50 Cans of beer per week   family history includes Heart disease in his father; Stroke in his mother.  ROS as above Medications: Current Outpatient Prescriptions  Medication Sig Dispense Refill  . amLODipine (NORVASC) 10 MG tablet Take 1 tablet (10 mg total) by mouth daily. 30 tablet 11  . aspirin 81 MG chewable tablet Chew by mouth.    . gabapentin (NEURONTIN) 300 MG capsule One tab PO qHS for a week, then BID for a week, then TID. May double weekly to a max of 3,600mg /day 180 capsule 3  . lisinopril-hydrochlorothiazide (PRINZIDE,ZESTORETIC) 20-25 MG tablet TAKE 1 TABLET BY MOUTH DAILY. 30 tablet 0  . tamsulosin (FLOMAX) 0.4 MG CAPS capsule  Take 1 capsule (0.4 mg total) by mouth daily after breakfast. 90 capsule 3  . cyclobenzaprine (FLEXERIL) 10 MG tablet Take 1 tablet (10 mg total) by mouth 3 (three) times daily as needed for muscle spasms. 30 tablet 0  . naproxen (NAPROSYN) 500 MG tablet Take 1 tablet (500 mg total) by mouth 2 (two) times daily with a meal. 30 tablet 0   No current facility-administered medications for this visit.   No Known Allergies   Exam:  BP 129/75 mmHg  Pulse 69  Wt 210 lb (95.255 kg) Gen: Well NAD HEENT: EOMI,  MMM Lungs: Normal work of breathing. CTABL Heart: RRR no MRG Abd: NABS, Soft. Nondistended, Nontender Exts: Brisk capillary refill, warm and well perfused.  Back: Nontender to midline. Tender palpation left SI joint area. Decreased flexion. Decreased lateral rotation and lateral flexion. Impaired extension by pain. Lower extremity strength reflexes and sensation are equal and normal throughout. Normal gait.  No results found for this or any previous visit (from the past 24 hour(s)). Dg Lumbar Spine Complete  06/14/2015  CLINICAL DATA:  Chronic back pain for 5 years, essential hypertension, lumbosacral strain initial encounter, DISH, BILATERAL lower extremity radiculopathy EXAM: LUMBAR SPINE - COMPLETE 4+ VIEW COMPARISON:  None FINDINGS: Five non-rib-bearing lumbar vertebra. Multilevel disc space narrowing and bulky endplate spur formation. Facet degenerative changes lower lumbar spine. Vertebral body heights maintained without fracture or subluxation. No spondylolysis. SI joints symmetric. Minimal atherosclerotic calcification aorta. IMPRESSION: Extensive degenerative disc disease changes and  minimal facet degenerative changes of the lumbar spine. No acute abnormalities. Electronically Signed   By: Lavonia Dana M.D.   On: 06/14/2015 25:74     65 year old male with 1) acute on chronic back pain: Patient has extensive degenerative changes. I do think he benefit from a trial of physical  therapy however he declines. Plan for naproxen Flexeril and heating pad.  2) hypertension: Much improved. Check metabolic panel.

## 2015-06-15 LAB — BASIC METABOLIC PANEL
BUN: 19 mg/dL (ref 7–25)
CALCIUM: 9.3 mg/dL (ref 8.6–10.3)
CHLORIDE: 104 mmol/L (ref 98–110)
CO2: 20 mmol/L (ref 20–31)
Creat: 1.05 mg/dL (ref 0.70–1.25)
Glucose, Bld: 83 mg/dL (ref 65–99)
Potassium: 4.3 mmol/L (ref 3.5–5.3)
SODIUM: 138 mmol/L (ref 135–146)

## 2015-06-15 NOTE — Progress Notes (Signed)
Quick Note:  Xray shows a lot of arthritis in the back. We can do a MRI if not better in a few weeks. ______

## 2015-06-15 NOTE — Progress Notes (Signed)
Quick Note:  Normal, no changes. ______ 

## 2015-06-18 ENCOUNTER — Telehealth: Payer: Self-pay | Admitting: Family Medicine

## 2015-06-18 DIAGNOSIS — G8929 Other chronic pain: Secondary | ICD-10-CM

## 2015-06-18 DIAGNOSIS — M549 Dorsalgia, unspecified: Principal | ICD-10-CM

## 2015-06-18 NOTE — Telephone Encounter (Signed)
Based on the conversation in the clinic I left the decision to proceed with MRI up to the patient. I will order a MRI. Pt should return a few days after the MRI to discuss results.

## 2015-06-18 NOTE — Telephone Encounter (Signed)
DR. Georgina Snell   Manuel Dawson's wife called asking about setting up an MRI. I told her we would get that set up for him if you will please place the referral. Thank you - CF

## 2015-06-21 ENCOUNTER — Other Ambulatory Visit: Payer: Self-pay | Admitting: Family Medicine

## 2015-06-28 ENCOUNTER — Ambulatory Visit (INDEPENDENT_AMBULATORY_CARE_PROVIDER_SITE_OTHER): Payer: BLUE CROSS/BLUE SHIELD

## 2015-06-28 DIAGNOSIS — M5126 Other intervertebral disc displacement, lumbar region: Secondary | ICD-10-CM

## 2015-06-28 DIAGNOSIS — G8929 Other chronic pain: Secondary | ICD-10-CM

## 2015-06-28 DIAGNOSIS — M4807 Spinal stenosis, lumbosacral region: Secondary | ICD-10-CM

## 2015-06-28 DIAGNOSIS — M4806 Spinal stenosis, lumbar region: Secondary | ICD-10-CM | POA: Diagnosis not present

## 2015-06-28 DIAGNOSIS — M549 Dorsalgia, unspecified: Principal | ICD-10-CM

## 2015-06-29 ENCOUNTER — Other Ambulatory Visit: Payer: Self-pay | Admitting: Family Medicine

## 2015-06-29 NOTE — Progress Notes (Signed)
Quick Note:  Multi level arthritis is present throughout. There are many levels of narrowing and pinched nerves. Return to clinic for further discussion about what can be done about these issues. ______

## 2015-07-05 ENCOUNTER — Ambulatory Visit (INDEPENDENT_AMBULATORY_CARE_PROVIDER_SITE_OTHER): Payer: BLUE CROSS/BLUE SHIELD | Admitting: Family Medicine

## 2015-07-05 ENCOUNTER — Encounter: Payer: Self-pay | Admitting: Family Medicine

## 2015-07-05 VITALS — BP 138/79 | HR 82 | Wt 213.0 lb

## 2015-07-05 DIAGNOSIS — G8929 Other chronic pain: Secondary | ICD-10-CM | POA: Diagnosis not present

## 2015-07-05 DIAGNOSIS — M549 Dorsalgia, unspecified: Secondary | ICD-10-CM | POA: Diagnosis not present

## 2015-07-05 NOTE — Progress Notes (Signed)
Manuel Dawson is a 65 y.o. male who presents to [facility] today for follow-up back pain. Patient has chronic intermittent back pain. He had an MRI of his lumbar spine on 06/28/2015 showed diffuse disease. He notes his back pain has significantly improved. He notes continued left thigh numbness and tingling symptoms. Her weakness or numbness bowel bladder dysfunction.   Past Medical History  Diagnosis Date  . Hypertension   . Skin cancer   . Cataract extraction status    Past Surgical History  Procedure Laterality Date  . Skin cancer excision     Social History  Substance Use Topics  . Smoking status: Current Every Day Smoker -- 3.00 packs/day for 45 years    Types: Cigars  . Smokeless tobacco: Not on file  . Alcohol Use: 30.0 oz/week    0 Standard drinks or equivalent, 50 Cans of beer per week   family history includes Heart disease in his father; Stroke in his mother.  ROS as above:  Medications: Current Outpatient Prescriptions  Medication Sig Dispense Refill  . amLODipine (NORVASC) 10 MG tablet Take 1 tablet (10 mg total) by mouth daily. 30 tablet 11  . aspirin 81 MG chewable tablet Chew by mouth.    Marland Kitchen lisinopril-hydrochlorothiazide (PRINZIDE,ZESTORETIC) 20-25 MG tablet TAKE 1 TABLET BY MOUTH DAILY. 30 tablet 0  . naproxen (NAPROSYN) 500 MG tablet TAKE 1 TABLET (500 MG TOTAL) BY MOUTH 2 (TWO) TIMES DAILY WITH A MEAL. 30 tablet 0  . tamsulosin (FLOMAX) 0.4 MG CAPS capsule Take 1 capsule (0.4 mg total) by mouth daily after breakfast. 90 capsule 3  . cyclobenzaprine (FLEXERIL) 10 MG tablet Take 1 tablet (10 mg total) by mouth 3 (three) times daily as needed for muscle spasms. (Patient not taking: Reported on 07/05/2015) 30 tablet 0   No current facility-administered medications for this visit.   No Known Allergies   Exam:  BP 138/79 mmHg  Pulse  82  Wt 213 lb (96.616 kg) Gen: Well NAD Back: Decreased motion. Lower sore strength is equal throughout. Normal gait.   CLINICAL DATA: 65 year old hypertensive male with low back pain with numbness and burning in upper legs after standing. No known injury. Subsequent encounter.  EXAM: MRI LUMBAR SPINE WITHOUT CONTRAST  TECHNIQUE: Multiplanar, multisequence MR imaging of the lumbar spine was performed. No intravenous contrast was administered.  COMPARISON: 06/14/2015 plain film exam. No comparison MR.  FINDINGS: Segmentation: Last fully open disk space is labeled L5-S1. Present examination incorporates from T11-12 disc space through the S2 level.  Alignment: Straightening of the lumbar spine.  Vertebrae: Multiple Schmorl's node deformities without focal compression fracture. Mild band like edema left lateral and anterior aspect L4 felt probably related to degenerative changes.  Conus medullaris: Extends to the L1-2 disc space level and appears normal.  Paraspinal and other soft tissues: Atherosclerotic changes aorta and iliac arteries without aneurysmal dilation.  Congenitally narrowed spinal canal secondary to short pedicles. Additionally:  Disc levels:  T11-12: Small Schmorl's node deformity. Minimal bulge.  T12-L1: Small Schmorl's node deformity.  L1-2: Bulge greatest right paracentral position with small annular fissure. Mild spinal stenosis greater on the right.  L2-3: Schmorl's node deformity. Bulge and osteophyte with lateral extension greater on left contacting the exiting left L2 nerve root in a far lateral position. Mild facet degenerative changes. Multifactorial mild spinal stenosis.  L3-4: Minimal Schmorl's node deformity. Bulge. Facet degenerative changes. Multifactorial marked spinal stenosis. Mild foraminal  narrowing greater on the left.  L4-5: Moderate bulge with bulge and osteophyte extending into the inferior aspect  the neural foramen which when combined with facet degenerative changes and short pedicles causes moderate right-sided and mild-to-moderate left-sided foraminal narrowing. Moderate right-sided and mild left-sided lateral recess stenosis. Multifactorial moderate thecal sac narrowing.  L5-S1: Prominent bulge and osteophyte with foraminal/ lateral extension greater on left. Moderate to marked left foraminal narrowing and mass effect upon the exiting left L5 nerve root. Right lateral disc osteophyte touches but does not compress the exiting right L5 nerve root. Mild left ligamentum flavum hypertrophy with tiny amount of fluid but without synovial cyst. Left paracentral extension of disc and osteophyte contribute to moderate left-sided lateral recess stenosis and minimal flattening of the adjacent thecal sac.  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 Summary of pertinent findings includes:  L3-4 multifactorial marked spinal stenosis. Mild foraminal narrowing greater on left.  L4-5 multifactorial moderate right-sided and mild-to-moderate left-sided foraminal narrowing. Moderate right-sided and mild left-sided lateral recess stenosis. Moderate thecal sac narrowing.  L5-S1 multifactorial moderate to marked left foraminal narrowing and mass effect upon the exiting left L5 nerve root. Multifactorial moderate left lateral recess stenosis and minimal flattening left ventral thecal sac.  L2-3 bulge and osteophyte with lateral extension greater on left contacting the exiting left L2 nerve root in a far lateral position. Multifactorial mild spinal stenosis.  L1-2 mild spinal stenosis greater on the right.  Please see above for further detail.   Electronically Signed  By: Genia Del M.D.  On: 06/28/2015 19:49  No results found for this or any previous visit (from the past 24 hour(s)). No results found.    Assessment and  Plan: 65 y.o. male with multilevel lumbar DDD. He does have left thigh radiculopathy likely from the left L2 or L3 nerve root. Symptoms are not bad enough that he would like to proceed with epidural steroid injection. Plan for watchful waiting and treatment of pain flareups as needed. Return as needed.  Discussed warning signs or symptoms. Please see discharge instructions. Patient expresses understanding.

## 2015-07-05 NOTE — Patient Instructions (Signed)
Thank you for coming in today. Return as needed.  Come back or go to the emergency room if you notice new weakness new numbness problems walking or bowel or bladder problems.

## 2015-07-14 ENCOUNTER — Other Ambulatory Visit: Payer: Self-pay | Admitting: Family Medicine

## 2015-07-31 ENCOUNTER — Other Ambulatory Visit: Payer: Self-pay | Admitting: Family Medicine

## 2015-08-02 ENCOUNTER — Telehealth: Payer: Self-pay

## 2015-08-02 NOTE — Telephone Encounter (Signed)
Lisinopril refilled with 2 refills. Rx request was made on SAT 07/31/2015.

## 2015-08-03 MED ORDER — LISINOPRIL-HYDROCHLOROTHIAZIDE 20-25 MG PO TABS: 1.0000 | ORAL_TABLET | Freq: Every day | ORAL | Status: DC

## 2015-08-03 NOTE — Addendum Note (Signed)
Addended by: Gregor Hams on: 08/03/2015 06:12 AM   Modules accepted: Orders

## 2015-08-03 NOTE — Telephone Encounter (Signed)
Med refilled.

## 2015-10-23 ENCOUNTER — Other Ambulatory Visit: Payer: Self-pay | Admitting: Family Medicine

## 2015-10-23 DIAGNOSIS — S39012A Strain of muscle, fascia and tendon of lower back, initial encounter: Secondary | ICD-10-CM

## 2015-10-23 DIAGNOSIS — I10 Essential (primary) hypertension: Secondary | ICD-10-CM

## 2015-10-23 DIAGNOSIS — G8929 Other chronic pain: Secondary | ICD-10-CM

## 2015-10-23 DIAGNOSIS — M549 Dorsalgia, unspecified: Principal | ICD-10-CM

## 2016-05-03 ENCOUNTER — Other Ambulatory Visit: Payer: Self-pay | Admitting: Family Medicine

## 2016-05-03 DIAGNOSIS — I10 Essential (primary) hypertension: Secondary | ICD-10-CM

## 2016-05-24 ENCOUNTER — Ambulatory Visit: Payer: BLUE CROSS/BLUE SHIELD | Admitting: Sports Medicine

## 2016-06-21 ENCOUNTER — Other Ambulatory Visit: Payer: Self-pay | Admitting: Family Medicine

## 2016-06-21 DIAGNOSIS — N400 Enlarged prostate without lower urinary tract symptoms: Secondary | ICD-10-CM

## 2016-06-29 ENCOUNTER — Encounter: Payer: Self-pay | Admitting: Family Medicine

## 2016-06-29 ENCOUNTER — Ambulatory Visit (INDEPENDENT_AMBULATORY_CARE_PROVIDER_SITE_OTHER): Payer: BLUE CROSS/BLUE SHIELD | Admitting: Family Medicine

## 2016-06-29 VITALS — BP 120/73 | HR 68 | Wt 207.0 lb

## 2016-06-29 DIAGNOSIS — M545 Low back pain: Secondary | ICD-10-CM

## 2016-06-29 DIAGNOSIS — R600 Localized edema: Secondary | ICD-10-CM

## 2016-06-29 DIAGNOSIS — G8929 Other chronic pain: Secondary | ICD-10-CM | POA: Diagnosis not present

## 2016-06-29 DIAGNOSIS — S39012A Strain of muscle, fascia and tendon of lower back, initial encounter: Secondary | ICD-10-CM

## 2016-06-29 DIAGNOSIS — I10 Essential (primary) hypertension: Secondary | ICD-10-CM

## 2016-06-29 MED ORDER — CYCLOBENZAPRINE HCL 10 MG PO TABS: 10.0000 mg | ORAL_TABLET | Freq: Three times a day (TID) | ORAL | 3 refills | Status: AC | PRN

## 2016-06-29 NOTE — Patient Instructions (Signed)
Thank you for coming in today. Have Brandy send me a letter with all the medicines you are taking including the name and dose of the blood pressure medicine you were given.  We will probably continue the medicines.  We should get labs within the next 3 months.  I will order them when you have your medicine list.   I recommend continue to cut back on cigars.    Hypertension Hypertension, commonly called high blood pressure, is when the force of blood pumping through the arteries is too strong. The arteries are the blood vessels that carry blood from the heart throughout the body. Hypertension forces the heart to work harder to pump blood and may cause arteries to become narrow or stiff. Having untreated or uncontrolled hypertension can cause heart attacks, strokes, kidney disease, and other problems. A blood pressure reading consists of a higher number over a lower number. Ideally, your blood pressure should be below 120/80. The first ("top") number is called the systolic pressure. It is a measure of the pressure in your arteries as your heart beats. The second ("bottom") number is called the diastolic pressure. It is a measure of the pressure in your arteries as the heart relaxes. What are the causes? The cause of this condition is not known. What increases the risk? Some risk factors for high blood pressure are under your control. Others are not. Factors you can change  Smoking.  Having type 2 diabetes mellitus, high cholesterol, or both.  Not getting enough exercise or physical activity.  Being overweight.  Having too much fat, sugar, calories, or salt (sodium) in your diet.  Drinking too much alcohol. Factors that are difficult or impossible to change  Having chronic kidney disease.  Having a family history of high blood pressure.  Age. Risk increases with age.  Race. You may be at higher risk if you are African-American.  Gender. Men are at higher risk than women before age  14. After age 66, women are at higher risk than men.  Having obstructive sleep apnea.  Stress. What are the signs or symptoms? Extremely high blood pressure (hypertensive crisis) may cause:  Headache.  Anxiety.  Shortness of breath.  Nosebleed.  Nausea and vomiting.  Severe chest pain.  Jerky movements you cannot control (seizures).  How is this diagnosed? This condition is diagnosed by measuring your blood pressure while you are seated, with your arm resting on a surface. The cuff of the blood pressure monitor will be placed directly against the skin of your upper arm at the level of your heart. It should be measured at least twice using the same arm. Certain conditions can cause a difference in blood pressure between your right and left arms. Certain factors can cause blood pressure readings to be lower or higher than normal (elevated) for a short period of time:  When your blood pressure is higher when you are in a health care provider's office than when you are at home, this is called white coat hypertension. Most people with this condition do not need medicines.  When your blood pressure is higher at home than when you are in a health care provider's office, this is called masked hypertension. Most people with this condition may need medicines to control blood pressure.  If you have a high blood pressure reading during one visit or you have normal blood pressure with other risk factors:  You may be asked to return on a different day to have your blood pressure  checked again.  You may be asked to monitor your blood pressure at home for 1 week or longer.  If you are diagnosed with hypertension, you may have other blood or imaging tests to help your health care provider understand your overall risk for other conditions. How is this treated? This condition is treated by making healthy lifestyle changes, such as eating healthy foods, exercising more, and reducing your alcohol  intake. Your health care provider may prescribe medicine if lifestyle changes are not enough to get your blood pressure under control, and if:  Your systolic blood pressure is above 130.  Your diastolic blood pressure is above 80.  Your personal target blood pressure may vary depending on your medical conditions, your age, and other factors. Follow these instructions at home: Eating and drinking  Eat a diet that is high in fiber and potassium, and low in sodium, added sugar, and fat. An example eating plan is called the DASH (Dietary Approaches to Stop Hypertension) diet. To eat this way: ? Eat plenty of fresh fruits and vegetables. Try to fill half of your plate at each meal with fruits and vegetables. ? Eat whole grains, such as whole wheat pasta, brown rice, or whole grain bread. Fill about one quarter of your plate with whole grains. ? Eat or drink low-fat dairy products, such as skim milk or low-fat yogurt. ? Avoid fatty cuts of meat, processed or cured meats, and poultry with skin. Fill about one quarter of your plate with lean proteins, such as fish, chicken without skin, beans, eggs, and tofu. ? Avoid premade and processed foods. These tend to be higher in sodium, added sugar, and fat.  Reduce your daily sodium intake. Most people with hypertension should eat less than 1,500 mg of sodium a day.  Limit alcohol intake to no more than 1 drink a day for nonpregnant women and 2 drinks a day for men. One drink equals 12 oz of beer, 5 oz of wine, or 1 oz of hard liquor. Lifestyle  Work with your health care provider to maintain a healthy body weight or to lose weight. Ask what an ideal weight is for you.  Get at least 30 minutes of exercise that causes your heart to beat faster (aerobic exercise) most days of the week. Activities may include walking, swimming, or biking.  Include exercise to strengthen your muscles (resistance exercise), such as pilates or lifting weights, as part of your  weekly exercise routine. Try to do these types of exercises for 30 minutes at least 3 days a week.  Do not use any products that contain nicotine or tobacco, such as cigarettes and e-cigarettes. If you need help quitting, ask your health care provider.  Monitor your blood pressure at home as told by your health care provider.  Keep all follow-up visits as told by your health care provider. This is important. Medicines  Take over-the-counter and prescription medicines only as told by your health care provider. Follow directions carefully. Blood pressure medicines must be taken as prescribed.  Do not skip doses of blood pressure medicine. Doing this puts you at risk for problems and can make the medicine less effective.  Ask your health care provider about side effects or reactions to medicines that you should watch for. Contact a health care provider if:  You think you are having a reaction to a medicine you are taking.  You have headaches that keep coming back (recurring).  You feel dizzy.  You have swelling in  your ankles.  You have trouble with your vision. Get help right away if:  You develop a severe headache or confusion.  You have unusual weakness or numbness.  You feel faint.  You have severe pain in your chest or abdomen.  You vomit repeatedly.  You have trouble breathing. Summary  Hypertension is when the force of blood pumping through your arteries is too strong. If this condition is not controlled, it may put you at risk for serious complications.  Your personal target blood pressure may vary depending on your medical conditions, your age, and other factors. For most people, a normal blood pressure is less than 120/80.  Hypertension is treated with lifestyle changes, medicines, or a combination of both. Lifestyle changes include weight loss, eating a healthy, low-sodium diet, exercising more, and limiting alcohol. This information is not intended to replace  advice given to you by your health care provider. Make sure you discuss any questions you have with your health care provider. Document Released: 01/09/2005 Document Revised: 12/08/2015 Document Reviewed: 12/08/2015 Elsevier Interactive Patient Education  [employer].

## 2016-06-29 NOTE — Progress Notes (Signed)
Manuel Dawson is a 66 y.o. male who presents to [facility] today for follow-up hypertension.  Manuel Dawson has a history of hypertension. He has a prescription for amlodipine and hydrochlorothiazide/lisinopril . He is not sure which medicines he is taking. He also notes that his wife gave him someone else's blood pressure medicine which helped to improve some leg swelling that he had. He did feel somewhat lightheaded and dizzy on this medication. He does not know the name of the medicines.  He also notes back pain. He has a history of chronic back pain due to DISH. He had an episode of worsening pain a few weeks ago that is now resolving. He is nearly pain-free. He denies any radiating pain or numbness into his lower extremities.  Leg swelling: Patient has mild bilateral leg swelling. This is significantly improved after taking the ministry medication that his wife gave him.   Past Medical History:  Diagnosis Date  . Cataract extraction status   . Hypertension   . Skin cancer    Past Surgical History:  Procedure Laterality Date  . SKIN CANCER EXCISION     Social History  Substance Use Topics  . Smoking status: Current Every Day Smoker    Packs/day: 3.00    Years: 45.00    Types: Cigars  . Smokeless tobacco: Never Used  . Alcohol use 30.0 oz/week    50 Cans of beer per week   family history includes Heart disease in his father; Stroke in his mother.  ROS as above:  Medications: Current Outpatient Prescriptions  Medication Sig Dispense Refill  . amLODipine (NORVASC) 10 MG tablet TAKE 1 TABLET (10 MG TOTAL) BY MOUTH DAILY. 30 tablet 11  . aspirin 81 MG chewable tablet Chew by mouth.    . cyclobenzaprine (FLEXERIL) 10 MG tablet Take 1 tablet (10 mg total) by mouth 3 (three) times daily as needed for muscle spasms. 90 tablet 3  . lisinopril-hydrochlorothiazide  (PRINZIDE,ZESTORETIC) 20-25 MG tablet Take 1 tablet by mouth daily. 90 tablet 2  . naproxen (NAPROSYN) 500 MG tablet TAKE 1 TABLET (500 MG TOTAL) BY MOUTH 2 (TWO) TIMES DAILY WITH A MEAL. 30 tablet 0  . tamsulosin (FLOMAX) 0.4 MG CAPS capsule Take 1 capsule (0.4 mg total) by mouth daily after breakfast. Appt required for refills. 30 capsule 0   No current facility-administered medications for this visit.    No Known Allergies  Health Maintenance Health Maintenance  Topic Date Due  . INFLUENZA VACCINE  08/23/2016  . TETANUS/TDAP  01/25/2025  . COLONOSCOPY  03/29/2025  . Hepatitis C Screening  Completed  . HIV Screening  Completed     Exam:  BP 120/73   Pulse 68   Wt 207 lb (93.9 kg)   BMI 28.87 kg/m  Gen: Well NAD HEENT: EOMI,  MMM Lungs: Normal work of breathing. CTABL Heart: RRR no MRG Abd: NABS, Soft. Nondistended, Nontender Exts: Brisk capillary refill, warm and well perfused.  Trace edema bilateral lower extremities   No results found for this or any previous visit (from the past 72 hour(s)). No results found.    Assessment and Plan: 66 y.o. male with  Hypertension at goal. I'm not quite sure what Manuel Dawson is taking. I've asked him go home and give me a medicine list. Once we confirm that his medications are what we think they are we'll obtain some fasting labs.  Back pain: Chronic with exacerbation. Plan for  intermittent Flexeril as needed. Return as needed.  Leg swelling: Likely due to side effect of amlodipine although I'm not sure if he actually is taking amlodipine or not. I'm not sure what the mystery pill was either. We'll again ask him to go home and figure out what medicines he is taking and what medicine he did take.   No orders of the defined types were placed in this encounter.  Meds ordered this encounter  Medications  . cyclobenzaprine (FLEXERIL) 10 MG tablet    Sig: Take 1 tablet (10 mg total) by mouth 3 (three) times daily as needed for muscle  spasms.    Dispense:  90 tablet    Refill:  3     Discussed warning signs or symptoms. Please see discharge instructions. Patient expresses understanding.

## 2016-07-03 ENCOUNTER — Telehealth: Payer: Self-pay

## 2016-07-03 DIAGNOSIS — I10 Essential (primary) hypertension: Secondary | ICD-10-CM

## 2016-07-03 NOTE — Telephone Encounter (Signed)
FYI: Pts wife called stating that the pt is taking the following medications: tamsulosin 0.4 MG daily Lisinopril-hctz  20-25 MG daily Amlodipine 10MG  daily Someone else's blood pressure medicine which helped to improve some leg swelling that he had mentioned at visit is furosemide 80 mg daily.

## 2016-07-04 NOTE — Addendum Note (Signed)
Addended by: Gregor Hams on: 07/04/2016 08:01 AM   Modules accepted: Orders

## 2016-07-04 NOTE — Telephone Encounter (Signed)
Continue prescribed medicine.  If leg swelling worsens and we can send in furosemide to take as needed.  Order for lab written.  Emeric should swing by the lab and get blood drawn. This is not fasting.

## 2016-07-04 NOTE — Telephone Encounter (Signed)
Called pt. No answer. Unable to leave VM. Will call back later. 

## 2016-07-05 NOTE — Telephone Encounter (Signed)
Left detailed vm with recommendations with pts wife. Requested a call back with concerns.

## 2016-07-20 ENCOUNTER — Other Ambulatory Visit: Payer: Self-pay | Admitting: Family Medicine

## 2016-07-20 DIAGNOSIS — N400 Enlarged prostate without lower urinary tract symptoms: Secondary | ICD-10-CM

## 2016-08-26 ENCOUNTER — Other Ambulatory Visit: Payer: Self-pay | Admitting: Family Medicine

## 2016-08-26 DIAGNOSIS — N400 Enlarged prostate without lower urinary tract symptoms: Secondary | ICD-10-CM

## 2016-09-23 ENCOUNTER — Other Ambulatory Visit: Payer: Self-pay | Admitting: Family Medicine

## 2016-09-23 DIAGNOSIS — N400 Enlarged prostate without lower urinary tract symptoms: Secondary | ICD-10-CM

## 2016-10-16 ENCOUNTER — Other Ambulatory Visit: Payer: Self-pay | Admitting: Family Medicine

## 2016-10-24 ENCOUNTER — Other Ambulatory Visit: Payer: Self-pay | Admitting: Family Medicine

## 2016-10-24 DIAGNOSIS — N400 Enlarged prostate without lower urinary tract symptoms: Secondary | ICD-10-CM

## 2016-12-03 ENCOUNTER — Other Ambulatory Visit: Payer: Self-pay | Admitting: Family Medicine

## 2016-12-03 DIAGNOSIS — N400 Enlarged prostate without lower urinary tract symptoms: Secondary | ICD-10-CM

## 2016-12-25 ENCOUNTER — Telehealth: Payer: Self-pay | Admitting: Family Medicine

## 2016-12-25 NOTE — Telephone Encounter (Signed)
I called patient and left a message informing him to call our office and schedule an appt follow up with Dr.Corey Patient needs to F/u on his HTN, pre diabetes and BPH

## 2016-12-26 ENCOUNTER — Other Ambulatory Visit: Payer: Self-pay | Admitting: Family Medicine

## 2016-12-26 DIAGNOSIS — N400 Enlarged prostate without lower urinary tract symptoms: Secondary | ICD-10-CM

## 2017-05-28 ENCOUNTER — Other Ambulatory Visit: Payer: Self-pay | Admitting: Family Medicine

## 2017-05-28 DIAGNOSIS — I10 Essential (primary) hypertension: Secondary | ICD-10-CM

## 2017-06-11 ENCOUNTER — Other Ambulatory Visit: Payer: Self-pay | Admitting: Family Medicine

## 2017-06-11 DIAGNOSIS — I10 Essential (primary) hypertension: Secondary | ICD-10-CM

## 2017-10-02 ENCOUNTER — Other Ambulatory Visit: Payer: Self-pay | Admitting: Family Medicine

## 2017-10-02 DIAGNOSIS — I10 Essential (primary) hypertension: Secondary | ICD-10-CM

## 2017-10-11 ENCOUNTER — Other Ambulatory Visit: Payer: Self-pay | Admitting: Family Medicine

## 2017-10-11 DIAGNOSIS — I10 Essential (primary) hypertension: Secondary | ICD-10-CM

## 2017-11-03 ENCOUNTER — Other Ambulatory Visit: Payer: Self-pay | Admitting: Family Medicine

## 2017-11-03 DIAGNOSIS — I10 Essential (primary) hypertension: Secondary | ICD-10-CM

## 2017-11-10 ENCOUNTER — Other Ambulatory Visit: Payer: Self-pay | Admitting: Family Medicine

## 2017-11-10 DIAGNOSIS — I10 Essential (primary) hypertension: Secondary | ICD-10-CM

## 2017-11-25 IMAGING — MR MR LUMBAR SPINE W/O CM
4 of 5 series · 23 of 48 positions shown · non-contrast
Comparison: 06/14/2015 plain film exam.  No comparison MR.

CLINICAL DATA: 65-year-old hypertensive male with low back pain
with numbness and burning in upper legs after standing. No known
injury. Subsequent encounter.

EXAM:
MRI LUMBAR SPINE WITHOUT CONTRAST
TECHNIQUE: Multiplanar, multisequence MR imaging of the lumbar spine was
performed. No intravenous contrast was administered.

[Series 2: T2 · sagittal · 4.0mm · 0.81mm/px · 6 of 17 slices shown (1 of 2)]
[im 1/17]
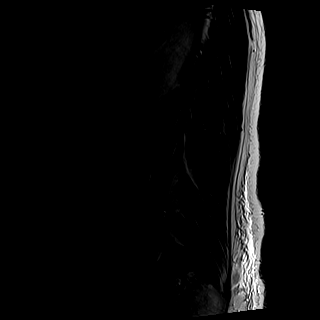
[im 4/17]
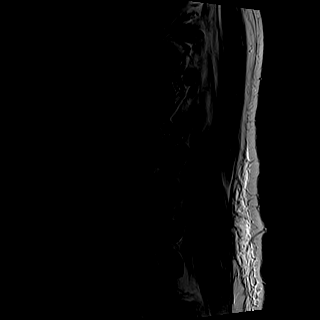
[im 7/17]
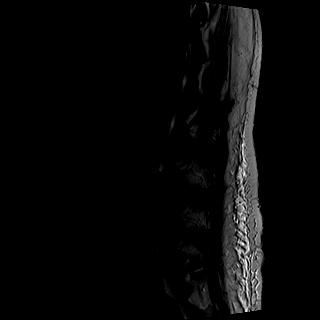
[im 10/17]
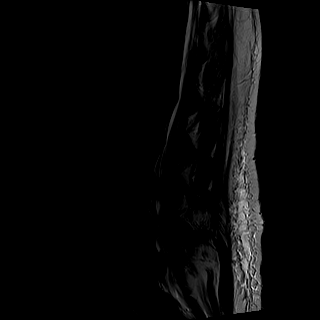
[im 13/17]
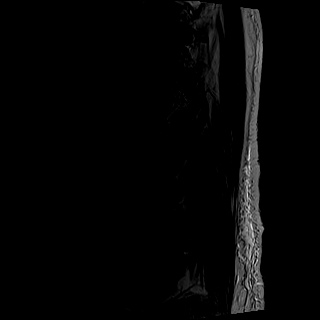
[im 17/17]
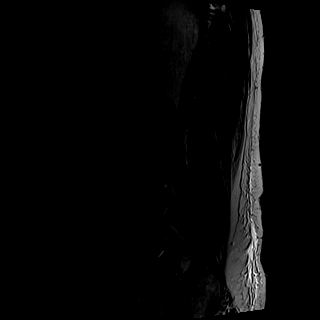

[Series 3: T1 · sagittal · 4.0mm · 0.41mm/px · 5 of 17 slices shown (1 of 2)]
[im 1/17]
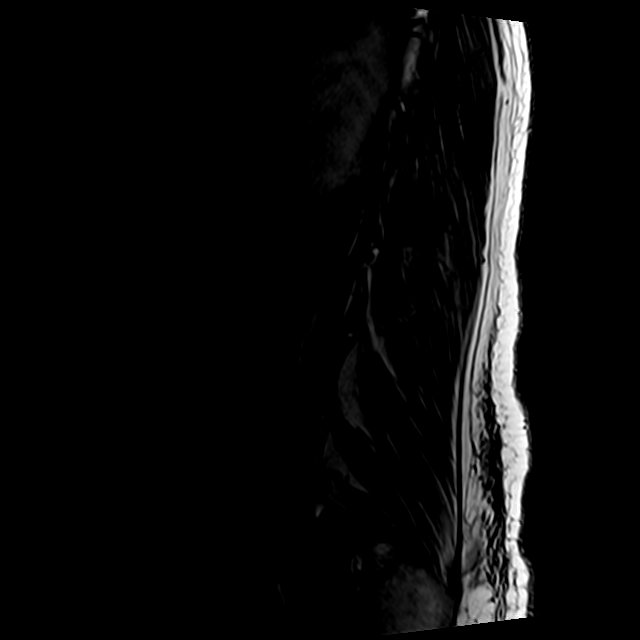
[im 4/17]
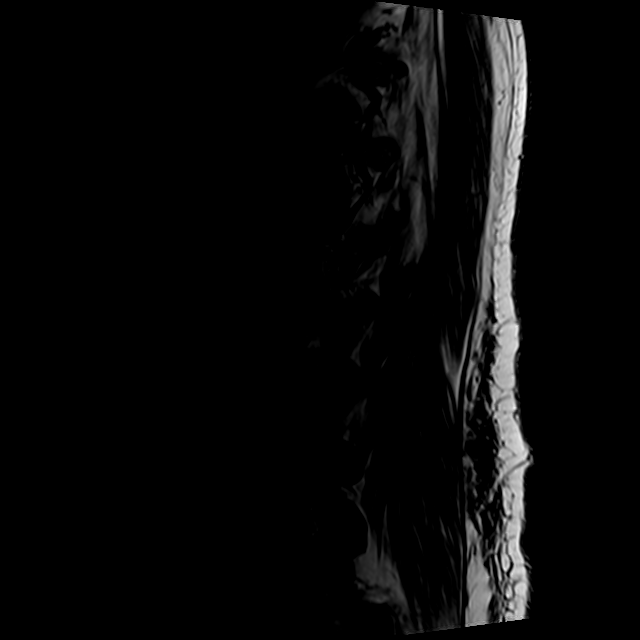
[im 7/17]
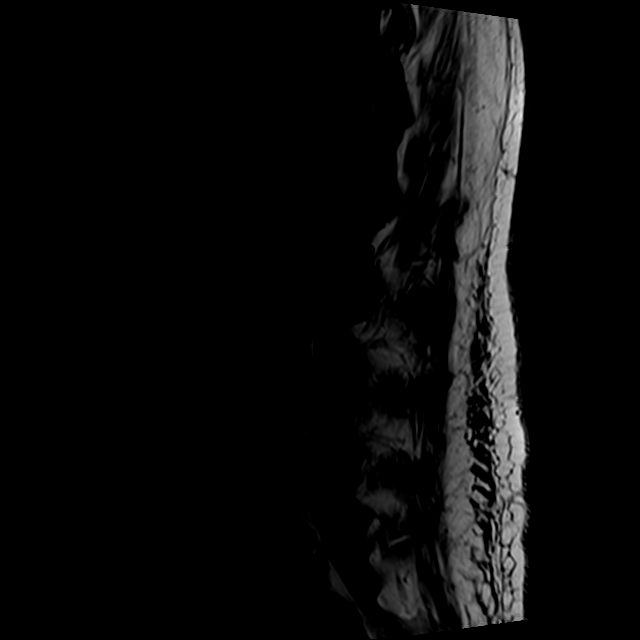
[im 10/17]
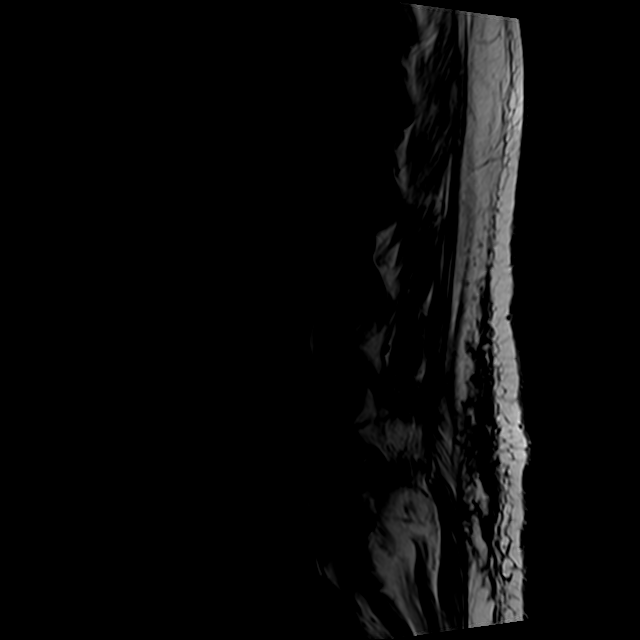
[im 17/17]
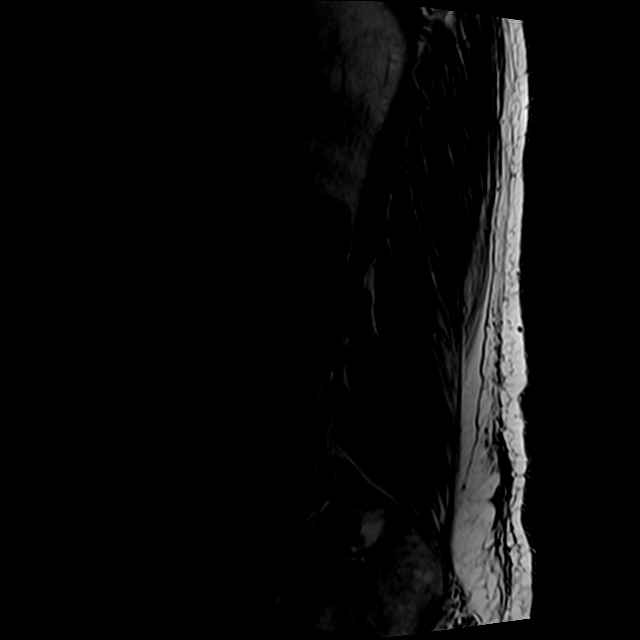

[Series 5: T2 · axial · 4.0mm · 0.78mm/px · z∈[-112,+128]mm · 9 of 43 slices shown (2 of 2)]
[im 1/43]
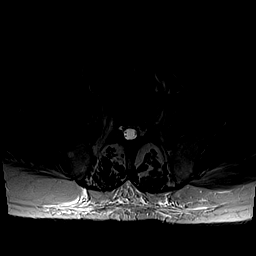
[im 7/43]
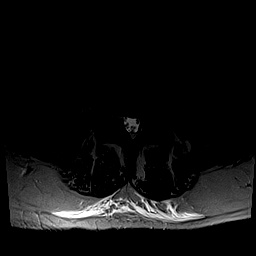
[im 13/43]
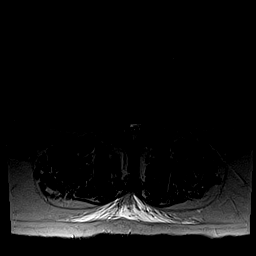
[im 19/43]
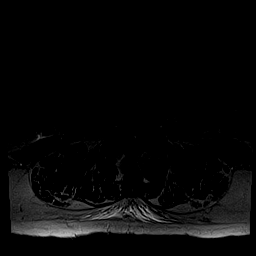
[im 22/43]
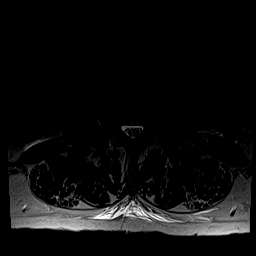
[im 25/43]
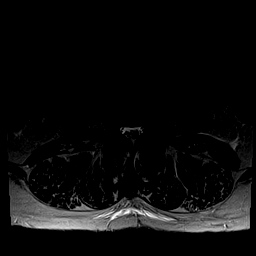
[im 31/43]
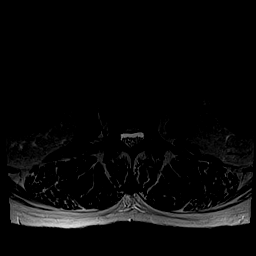
[im 37/43]
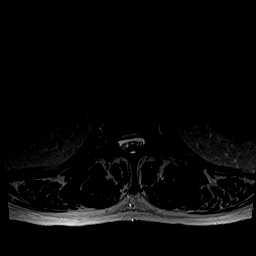
[im 43/43]
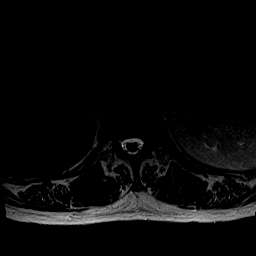

[Series 6: T1 · axial · 4.0mm · 0.31mm/px · z∈[-82,+99]mm · 3 of 43 slices shown (2 of 2)]
[im 7/43]
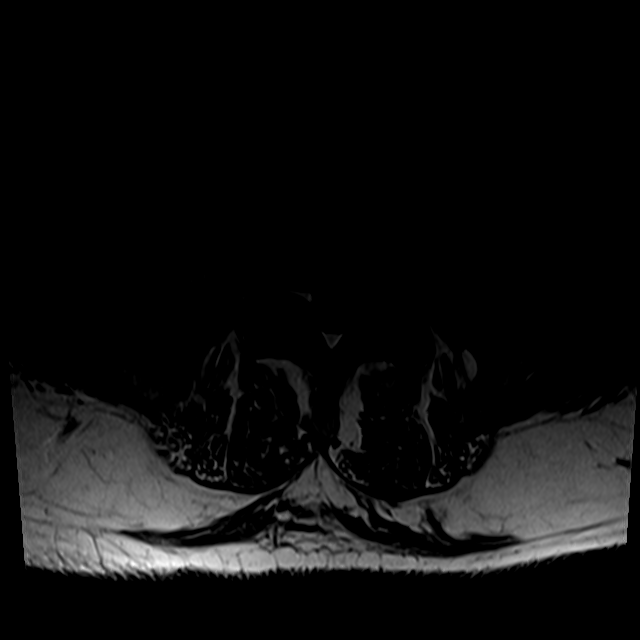
[im 22/43]
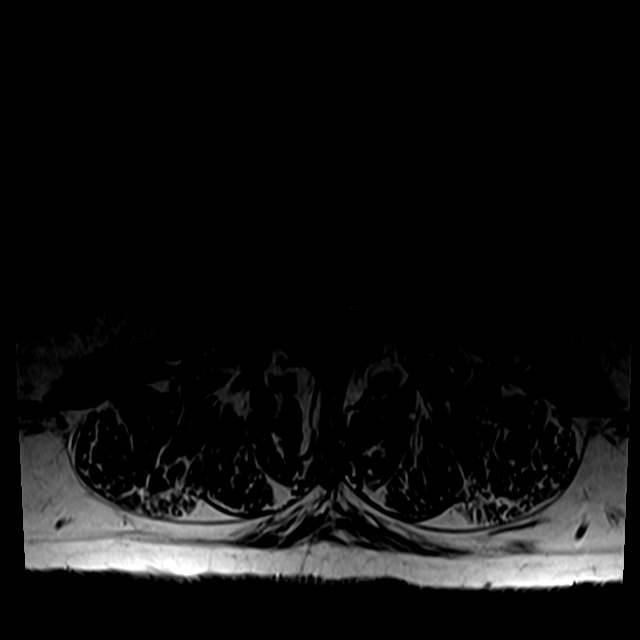
[im 37/43]
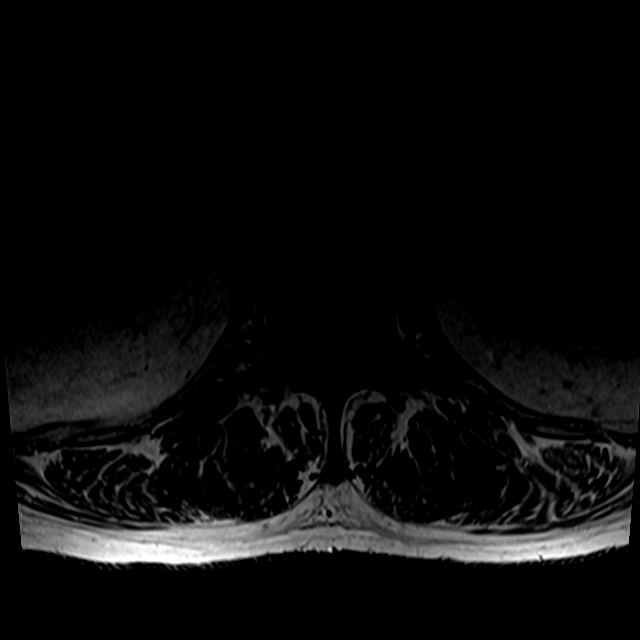

[23 of 48 positions shown; findings below may reference images not displayed]

FINDINGS: Segmentation: Last fully open disk space is labeled L5-S1. Present
examination incorporates from T11-12 disc space through the S2
level.

Alignment:  Straightening of the lumbar spine.

Vertebrae: Multiple Schmorl's node deformities without focal
compression fracture. Mild band like edema left lateral and anterior
aspect L4 felt probably related to degenerative changes.

Conus medullaris: Extends to the L1-2 disc space level and appears
normal.

Paraspinal and other soft tissues: Atherosclerotic changes aorta and
iliac arteries without aneurysmal dilation.

Congenitally narrowed spinal canal secondary to short pedicles.
Additionally:

Disc levels:

T11-12:  Small Schmorl's node deformity.  Minimal bulge.

T12-L1:  Small Schmorl's node deformity.

L1-2: Bulge greatest right paracentral position with small annular
fissure. Mild spinal stenosis greater on the right.

L2-3: Schmorl's node deformity. Bulge and osteophyte with lateral
extension greater on left contacting the exiting left L2 nerve root
in a far lateral position. Mild facet degenerative changes.
Multifactorial mild spinal stenosis.

L3-4: Minimal Schmorl's node deformity. Bulge. Facet degenerative
changes. Multifactorial marked spinal stenosis. Mild foraminal
narrowing greater on the left.

L4-5: Moderate bulge with bulge and osteophyte extending into the
inferior aspect the neural foramen which when combined with facet
degenerative changes and short pedicles causes moderate right-sided
and mild-to-moderate left-sided foraminal narrowing. Moderate
right-sided and mild left-sided lateral recess stenosis.
Multifactorial moderate thecal sac narrowing.

L5-S1: Prominent bulge and osteophyte with foraminal/ lateral
extension greater on left. Moderate to marked left foraminal
narrowing and mass effect upon the exiting left L5 nerve root. Right
lateral disc osteophyte touches but does not compress the exiting
right L5 nerve root. Mild left ligamentum flavum hypertrophy with
tiny amount of fluid but without synovial cyst. Left paracentral
extension of disc and osteophyte contribute to moderate left-sided
lateral recess stenosis and minimal flattening of the adjacent
thecal sac.
IMPRESSION: Congenitally narrowed spinal canal with superimposed degenerative
changes. Summary of pertinent findings includes:

L3-4 multifactorial marked spinal stenosis. Mild foraminal narrowing
greater on left.

L4-5 multifactorial moderate right-sided and mild-to-moderate
left-sided foraminal narrowing. Moderate right-sided and mild
left-sided lateral recess stenosis. Moderate thecal sac narrowing.

L5-S1 multifactorial moderate to marked left foraminal narrowing and
mass effect upon the exiting left L5 nerve root. Multifactorial
moderate left lateral recess stenosis and minimal flattening left
ventral thecal sac.

L2-3 bulge and osteophyte with lateral extension greater on left
contacting the exiting left L2 nerve root in a far lateral position.
Multifactorial mild spinal stenosis.

L1-2 mild spinal stenosis greater on the right.

Please see above for further detail.

## 2017-12-03 ENCOUNTER — Encounter: Payer: Self-pay | Admitting: Family Medicine

## 2017-12-10 ENCOUNTER — Other Ambulatory Visit: Payer: Self-pay | Admitting: Family Medicine

## 2017-12-10 DIAGNOSIS — I10 Essential (primary) hypertension: Secondary | ICD-10-CM

## 2017-12-24 ENCOUNTER — Telehealth: Payer: Self-pay | Admitting: Family Medicine

## 2017-12-24 NOTE — Telephone Encounter (Signed)
Left VM for Pt to return clinic call.  

## 2017-12-24 NOTE — Telephone Encounter (Signed)
Our records indicate that you are due for an influenza vaccine. Because of your age group you are at higher risk for dying or being hospitalized from influenza. We can reduce this risk by giving a flu vaccine.   If you have already had a flu vaccine please let me know.   If you have not had a flu vaccine please call [phone] and schedule a nurse appointment. You do not need to see me to get a flu vaccine.   If you have any questions or concerns, please don't hesitate to call.

## 2018-01-04 ENCOUNTER — Other Ambulatory Visit: Payer: Self-pay | Admitting: Family Medicine

## 2018-01-04 DIAGNOSIS — I10 Essential (primary) hypertension: Secondary | ICD-10-CM

## 2018-01-22 ENCOUNTER — Other Ambulatory Visit: Payer: Self-pay | Admitting: Family Medicine

## 2018-01-22 DIAGNOSIS — I10 Essential (primary) hypertension: Secondary | ICD-10-CM

## 2018-02-06 ENCOUNTER — Other Ambulatory Visit: Payer: Self-pay | Admitting: Family Medicine

## 2018-02-06 DIAGNOSIS — I10 Essential (primary) hypertension: Secondary | ICD-10-CM

## 2018-02-21 ENCOUNTER — Other Ambulatory Visit: Payer: Self-pay | Admitting: Family Medicine

## 2018-02-21 DIAGNOSIS — I10 Essential (primary) hypertension: Secondary | ICD-10-CM

## 2018-03-08 ENCOUNTER — Other Ambulatory Visit: Payer: Self-pay | Admitting: Family Medicine

## 2018-03-08 DIAGNOSIS — I10 Essential (primary) hypertension: Secondary | ICD-10-CM

## 2018-04-07 ENCOUNTER — Other Ambulatory Visit: Payer: Self-pay | Admitting: Family Medicine

## 2018-04-07 DIAGNOSIS — I10 Essential (primary) hypertension: Secondary | ICD-10-CM

## 2018-06-13 ENCOUNTER — Ambulatory Visit: Payer: BLUE CROSS/BLUE SHIELD | Admitting: Osteopathic Medicine

## 2022-06-22 ENCOUNTER — Ambulatory Visit: Payer: BLUE CROSS/BLUE SHIELD
